# Patient Record
Sex: Female | Born: 1950 | Race: White | Hispanic: No | Smoking: Never smoker
Health system: Southern US, Community
[De-identification: ages and names within clinical notes are randomized; demographics above are authoritative.]

## PROBLEM LIST (undated history)

## (undated) DIAGNOSIS — M5126 Other intervertebral disc displacement, lumbar region: Secondary | ICD-10-CM

## (undated) DIAGNOSIS — M797 Fibromyalgia: Secondary | ICD-10-CM

## (undated) DIAGNOSIS — I10 Essential (primary) hypertension: Secondary | ICD-10-CM

## (undated) DIAGNOSIS — K219 Gastro-esophageal reflux disease without esophagitis: Secondary | ICD-10-CM

## (undated) DIAGNOSIS — E7800 Pure hypercholesterolemia, unspecified: Secondary | ICD-10-CM

## (undated) DIAGNOSIS — F329 Major depressive disorder, single episode, unspecified: Secondary | ICD-10-CM

## (undated) DIAGNOSIS — E559 Vitamin D deficiency, unspecified: Secondary | ICD-10-CM

## (undated) DIAGNOSIS — F3289 Other specified depressive episodes: Secondary | ICD-10-CM

## (undated) HISTORY — DX: Other intervertebral disc displacement, lumbar region: M51.26

## (undated) HISTORY — DX: Vitamin D deficiency, unspecified: E55.9

## (undated) HISTORY — DX: Pure hypercholesterolemia, unspecified: E78.00

## (undated) HISTORY — PX: TONSILLECTOMY: SUR1361

## (undated) HISTORY — DX: Other specified depressive episodes: F32.89

## (undated) HISTORY — PX: TUBAL LIGATION: SHX77

## (undated) HISTORY — DX: Gastro-esophageal reflux disease without esophagitis: K21.9

## (undated) HISTORY — DX: Fibromyalgia: M79.7

## (undated) HISTORY — DX: Essential (primary) hypertension: I10

## (undated) HISTORY — DX: Major depressive disorder, single episode, unspecified: F32.9

## (undated) HISTORY — PX: LAPAROSCOPIC CHOLECYSTECTOMY: SUR755

---

## 2006-06-29 ENCOUNTER — Emergency Department (HOSPITAL_COMMUNITY): Admission: EM | Admit: 2006-06-29 | Discharge: 2006-06-29 | Payer: Self-pay | Admitting: Emergency Medicine

## 2011-12-25 DIAGNOSIS — R079 Chest pain, unspecified: Secondary | ICD-10-CM

## 2012-03-13 ENCOUNTER — Encounter: Payer: Self-pay | Admitting: Cardiology

## 2012-04-09 ENCOUNTER — Encounter: Payer: Self-pay | Admitting: *Deleted

## 2012-04-09 ENCOUNTER — Ambulatory Visit (INDEPENDENT_AMBULATORY_CARE_PROVIDER_SITE_OTHER): Payer: Medicare Other | Admitting: Cardiology

## 2012-04-09 ENCOUNTER — Encounter: Payer: Self-pay | Admitting: Cardiology

## 2012-04-09 VITALS — BP 100/66 | HR 61 | Ht 64.0 in | Wt 169.0 lb

## 2012-04-09 DIAGNOSIS — E785 Hyperlipidemia, unspecified: Secondary | ICD-10-CM | POA: Insufficient documentation

## 2012-04-09 DIAGNOSIS — I10 Essential (primary) hypertension: Secondary | ICD-10-CM

## 2012-04-09 DIAGNOSIS — R079 Chest pain, unspecified: Secondary | ICD-10-CM | POA: Insufficient documentation

## 2012-04-09 DIAGNOSIS — R072 Precordial pain: Secondary | ICD-10-CM

## 2012-04-09 NOTE — Patient Instructions (Signed)
   Stress Echo  Office will contact with results Continue all current medications. Follow up in  6 weeks

## 2012-04-09 NOTE — Assessment & Plan Note (Signed)
Continue statin. 

## 2012-04-09 NOTE — Progress Notes (Signed)
HPI: 61 yo female for evaluation of chest pain. Labs 11/13 with BUN and Cr 26 and 0.87. LFTs normal. Hgb Normal. Patient has had intermittent CP for years. Pain increases with stress. Substernal and described as a sharp pain. No radiation or associated symptoms. Lasts several minutes and resolves spontaneously. No dyspnea. Because of the above, we were asked to evaluate.  Current Outpatient Prescriptions  Medication Sig Dispense Refill  . acetaminophen (TYLENOL) 500 MG tablet Take 500 mg by mouth every 6 (six) hours as needed.      . cetirizine (ZYRTEC) 10 MG tablet Take 10 mg by mouth daily. Alternate with claritan      . ergocalciferol (VITAMIN D2) 50000 UNITS capsule Take 50,000 Units by mouth once a week.      . fluticasone (FLONASE) 50 MCG/ACT nasal spray Place 2 sprays into the nose daily.      Marland Kitchen gabapentin (NEURONTIN) 300 MG capsule Take 300-600 mg by mouth 3 (three) times daily.      Marland Kitchen loratadine (CLARITIN) 10 MG tablet Take 10 mg by mouth daily. Alternate between zyrtec      . metoprolol (LOPRESSOR) 50 MG tablet Take 25 mg by mouth 2 (two) times daily.      . sertraline (ZOLOFT) 100 MG tablet Take 50-100 mg by mouth daily.       . simvastatin (ZOCOR) 20 MG tablet Take 20 mg by mouth every evening.      . triamterene-hydrochlorothiazide (MAXZIDE-25) 37.5-25 MG per tablet Take 1 tablet by mouth daily.        No Known Allergies  Past Medical History  Diagnosis Date  . Essential hypertension, benign   . Displacement of lumbar intervertebral disc without myelopathy   . Fibromyalgia   . Depressive disorder, not elsewhere classified   . Pure hypercholesterolemia   . Unspecified vitamin D deficiency   . GERD (gastroesophageal reflux disease)     Past Surgical History  Procedure Date  . Tonsillectomy   . Laparoscopic cholecystectomy     with bile leak, ERCP and stent  . Tubal ligation     History   Social History  . Marital Status: Divorced    Spouse Name: N/A    Number of  Children: 3  . Years of Education: N/A   Occupational History  .      Disabled    Social History Main Topics  . Smoking status: Never Smoker   . Smokeless tobacco: Never Used  . Alcohol Use: No  . Drug Use: No  . Sexually Active: Not on file   Other Topics Concern  . Not on file   Social History Narrative  . No narrative on file    Family History  Problem Relation Age of Onset  . Heart attack Father     Died of MI at age 56  . Dementia Mother     Alive    ROS: no fevers or chills, productive cough, hemoptysis, dysphasia, odynophagia, melena, hematochezia, dysuria, hematuria, rash, seizure activity, orthopnea, PND, pedal edema, claudication. Remaining systems are negative.  Physical Exam:   Blood pressure 100/66, pulse 61, height 5\' 4"  (1.626 m), weight 169 lb (76.658 kg).  General:  Well developed/well nourished in NAD Skin warm/dry Patient not depressed No peripheral clubbing Back-normal HEENT-normal/normal eyelids Neck supple/normal carotid upstroke bilaterally; no bruits; no JVD; no thyromegaly chest - CTA/ normal expansion CV - RRR/normal S1 and S2; no murmurs, rubs or gallops;  PMI nondisplaced Abdomen -NT/ND, no HSM, no mass, +  bowel sounds, no bruit 2+ femoral pulses, no bruits Ext-no edema, chords, 2+ DP Neuro-grossly nonfocal  ECG Sinus bradycardia with no ST changes.

## 2012-04-09 NOTE — Assessment & Plan Note (Signed)
Continue present BP meds  

## 2012-04-09 NOTE — Assessment & Plan Note (Signed)
Symptoms atypical; plan stress echo; if neg, no further cardiac evaluation.

## 2012-04-11 ENCOUNTER — Other Ambulatory Visit: Payer: Self-pay | Admitting: *Deleted

## 2012-04-11 DIAGNOSIS — R072 Precordial pain: Secondary | ICD-10-CM

## 2012-04-15 ENCOUNTER — Encounter: Payer: Self-pay | Admitting: *Deleted

## 2012-05-22 ENCOUNTER — Ambulatory Visit: Payer: Medicare Other | Admitting: Physician Assistant

## 2012-05-31 ENCOUNTER — Encounter: Payer: Self-pay | Admitting: Physician Assistant

## 2012-05-31 ENCOUNTER — Ambulatory Visit (INDEPENDENT_AMBULATORY_CARE_PROVIDER_SITE_OTHER): Payer: Medicare Other | Admitting: Physician Assistant

## 2012-05-31 VITALS — BP 100/60 | HR 59 | Ht 64.5 in | Wt 169.8 lb

## 2012-05-31 DIAGNOSIS — R079 Chest pain, unspecified: Secondary | ICD-10-CM

## 2012-05-31 MED ORDER — ASPIRIN EC 81 MG PO TBEC: 81.0000 mg | DELAYED_RELEASE_TABLET | Freq: Every day | ORAL | Status: AC

## 2012-05-31 NOTE — Patient Instructions (Signed)
   Begin Aspirin 81mg  daily Continue all other current medications. Follow up as needed

## 2012-05-31 NOTE — Assessment & Plan Note (Signed)
Followed by primary M.D. 

## 2012-05-31 NOTE — Assessment & Plan Note (Signed)
No further workup indicated. We reviewed the results of the recent normal stress echo test. She presents with no interim development of exertional CP. She has cardiac risk factors notable for HTN, HLD, FH, and age, and I suggested she start ASA 81 mg daily for primary prevention. She is to otherwise return to [place] on an as-needed basis.

## 2012-05-31 NOTE — Progress Notes (Signed)
Primary Cardiologist: Simona Huh, MD (new)   HPI: Scheduled early followup for review of recent [facility] echocardiogram, ordered by Dr. Jens Som at time of last OV, for evaluation of intermittent CP.   Patient presented with no known history of CAD. However, she  became concerned after turning age 62, given that her father died of an MI at age 57.   - GXT echocardiogram, January 4 (89% [facility]PH[facility]): Negative stress echo; EF 55-60%, no WMAs  Patient returns today feeling much better, and reporting no exertional CP.  No Known Allergies  Current Outpatient Prescriptions  Medication Sig Dispense Refill  . acetaminophen (TYLENOL) 500 MG tablet Take 500 mg by mouth every 6 (six) hours as needed.      . cetirizine (ZYRTEC) 10 MG tablet Take 10 mg by mouth daily. Alternate with claritan      . ergocalciferol (VITAMIN D2) 50000 UNITS capsule Take 50,000 Units by mouth once a week.      . fluticasone (FLONASE) 50 MCG/ACT nasal spray Place 2 sprays into the nose daily.      Marland Kitchen gabapentin (NEURONTIN) 300 MG capsule Take 300-600 mg by mouth 3 (three) times daily.      . metoprolol (LOPRESSOR) 50 MG tablet Take 25 mg by mouth 2 (two) times daily.      . sertraline (ZOLOFT) 100 MG tablet Take 50-100 mg by mouth daily.       . simvastatin (ZOCOR) 20 MG tablet Take 20 mg by mouth every evening.      . triamterene-hydrochlorothiazide (MAXZIDE-25) 37.5-25 MG per tablet Take 1 tablet by mouth daily.      Marland Kitchen aspirin EC 81 MG tablet Take 1 tablet (81 mg total) by mouth daily.       No current facility-administered medications for this visit.    Past Medical History  Diagnosis Date  . Essential hypertension, benign   . Displacement of lumbar intervertebral disc without myelopathy   . Fibromyalgia   . Depressive disorder, not elsewhere classified   . Pure hypercholesterolemia   . Unspecified vitamin D deficiency   . GERD (gastroesophageal reflux disease)     Past Surgical History  Procedure Laterality Date  .  Tonsillectomy    . Laparoscopic cholecystectomy      with bile leak, ERCP and stent  . Tubal ligation      History   Social History  . Marital Status: Divorced    Spouse Name: N/A    Number of Children: 3  . Years of Education: N/A   Occupational History  .      Disabled    Social History Main Topics  . Smoking status: Never Smoker   . Smokeless tobacco: Never Used  . Alcohol Use: No  . Drug Use: No  . Sexually Active: Not on file   Other Topics Concern  . Not on file   Social History Narrative  . No narrative on file    Family History  Problem Relation Age of Onset  . Heart attack Father     Died of MI at age 58  . Dementia Mother     Alive    ROS: no nausea, vomiting; no fever, chills; no melena, hematochezia; no claudication  PHYSICAL EXAM: BP 100/60  Pulse 59  Ht 5' 4.5" (1.638 m)  Wt 169 lb 12.8 oz (77.021 kg)  BMI 28.71 kg/m2  SpO2 96% GENERAL: 62 year old female; NAD HEENT: NCAT, PERRLA, EOMI; sclera clear; no xanthelasma NECK: palpable bilateral carotid pulses, no bruits;  no JVD; no TM LUNGS: CTA bilaterally CARDIAC: RRR (S1, S2); no significant murmurs; no rubs or gallops ABDOMEN: soft, non-tender; intact BS EXTREMETIES: no significant peripheral edema SKIN: warm/dry; no obvious rash/lesions MUSCULOSKELETAL: no joint deformity NEURO: no focal deficit; NL affect   EKG:    ASSESSMENT & PLAN:  Chest pain No further workup indicated. We reviewed the results of the recent normal stress echo test. She presents with no interim development of exertional CP. She has cardiac risk factors notable for HTN, HLD, FH, and age, and I suggested she start ASA 81 mg daily for primary prevention. She is to otherwise return to [place] on an as-needed basis.  Essential hypertension Followed by primary M.D.  Hyperlipidemia Followed by primary M.D.    Gene Anaeli Cornwall, PAC

## 2013-04-09 ENCOUNTER — Telehealth: Payer: Self-pay

## 2013-04-09 NOTE — Telephone Encounter (Signed)
Sent letter to patient to advise of new physicians in cardiology.

## 2015-08-24 ENCOUNTER — Ambulatory Visit: Payer: Self-pay | Admitting: Allergy and Immunology

## 2015-10-13 ENCOUNTER — Emergency Department (HOSPITAL_COMMUNITY): Payer: Medicare Other

## 2015-10-13 ENCOUNTER — Encounter (HOSPITAL_COMMUNITY): Payer: Self-pay | Admitting: Emergency Medicine

## 2015-10-13 ENCOUNTER — Emergency Department (HOSPITAL_COMMUNITY)
Admission: EM | Admit: 2015-10-13 | Discharge: 2015-10-13 | Disposition: A | Discharge status: Home / Self Care | Payer: Medicare Other | Attending: Emergency Medicine | Admitting: Emergency Medicine

## 2015-10-13 DIAGNOSIS — Y999 Unspecified external cause status: Secondary | ICD-10-CM | POA: Insufficient documentation

## 2015-10-13 DIAGNOSIS — S93601A Unspecified sprain of right foot, initial encounter: Secondary | ICD-10-CM | POA: Diagnosis not present

## 2015-10-13 DIAGNOSIS — Y929 Unspecified place or not applicable: Secondary | ICD-10-CM | POA: Diagnosis not present

## 2015-10-13 DIAGNOSIS — Y939 Activity, unspecified: Secondary | ICD-10-CM | POA: Insufficient documentation

## 2015-10-13 DIAGNOSIS — F329 Major depressive disorder, single episode, unspecified: Secondary | ICD-10-CM | POA: Insufficient documentation

## 2015-10-13 DIAGNOSIS — I10 Essential (primary) hypertension: Secondary | ICD-10-CM | POA: Diagnosis not present

## 2015-10-13 DIAGNOSIS — S99921A Unspecified injury of right foot, initial encounter: Secondary | ICD-10-CM | POA: Diagnosis present

## 2015-10-13 DIAGNOSIS — W010XXA Fall on same level from slipping, tripping and stumbling without subsequent striking against object, initial encounter: Secondary | ICD-10-CM | POA: Insufficient documentation

## 2015-10-13 MED ORDER — IBUPROFEN 600 MG PO TABS: 600.0000 mg | ORAL_TABLET | Freq: Four times a day (QID) | ORAL | Status: DC | PRN

## 2015-10-13 NOTE — ED Notes (Signed)
Patient complains of right foot pain from fall this morning. Patient states slipped and twisted ankle. Denies hitting head.

## 2015-10-13 NOTE — Discharge Instructions (Signed)
Foot Sprain °A foot sprain is an injury to one of the strong bands of tissue (ligaments) that connect and support the many bones in your feet. The ligament can be stretched too much or it can tear. A tear can be either partial or complete. The severity of the sprain depends on how much of the ligament was damaged or torn. °CAUSES °A foot sprain is usually caused by suddenly twisting or pivoting your foot. °RISK FACTORS °This injury is more likely to occur in people who: °· Play a sport, such as basketball or football. °· Exercise or play a sport without warming up. °· Start a new workout or sport. °· Suddenly increase how long or hard they exercise or play a sport. °SYMPTOMS °Symptoms of this condition start soon after an injury and include: °· Pain, especially in the arch of the foot. °· Bruising. °· Swelling. °· Inability to walk or use the foot to support body weight. °DIAGNOSIS °This condition is diagnosed with a medical history and physical exam. You may also have imaging tests, such as: °· X-rays to make sure there are no broken bones (fractures). °· MRI to see if the ligament has torn. °TREATMENT °Treatment varies depending on the severity of your sprain. Mild sprains can be treated with rest, ice, compression, and elevation (RICE). If your ligament is overstretched or partially torn, treatment usually involves keeping your foot in a fixed position (immobilization) for a period of time. To help you do this, your health care provider will apply a bandage, splint, or walking boot to keep your foot from moving until it heals. You may also be advised to use crutches or a scooter for a few weeks to avoid bearing weight on your foot while it is healing. °If your ligament is fully torn, you may need surgery to reconnect the ligament to the bone. After surgery, a cast or splint will be applied and will need to stay on your foot while it heals. °Your health care provider may also suggest exercises or physical therapy  to strengthen your foot. °HOME CARE INSTRUCTIONS °If You Have a Bandage, Splint, or Walking Boot: °· Wear it as directed by your health care provider. Remove it only as directed by your health care provider. °· Loosen the bandage, splint, or walking boot if your toes become numb and tingle, or if they turn cold and blue. °Bathing °· If your health care provider approves bathing and showering, cover the bandage or splint with a watertight plastic bag to protect it from water. Do not let the bandage or splint get wet. °Managing Pain, Stiffness, and Swelling  °· If directed, apply ice to the injured area: °¨ Put ice in a plastic bag. °¨ Place a towel between your skin and the bag. °¨ Leave the ice on for 20 minutes, 2-3 times per day. °· Move your toes often to avoid stiffness and to lessen swelling. °· Raise (elevate) the injured area above the level of your heart while you are sitting or lying down. °Driving °· Do not drive or operate heavy machinery while taking pain medicine. °· Do not drive while wearing a bandage, splint, or walking boot on a foot that you use for driving. °Activity °· Rest as directed by your health care provider. °· Do not use the injured foot to support your body weight until your health care provider says that you can. Use crutches or other supportive devices as directed by your health care provider. °· Ask your health care   provider what activities are safe for you. Gradually increase how much and how far you walk until your health care provider says it is safe to return to full activity.  Do any exercise or physical therapy as directed by your health care provider. General Instructions  If a splint was applied, do not put pressure on any part of it until it is fully hardened. This may take several hours.  Take medicines only as directed by your health care provider. These include over-the-counter medicines and prescription medicines.  Keep all follow-up visits as directed by your  health care provider. This is important.  When you can walk without pain, wear supportive shoes that have stiff soles. Do not wear flip-flops, and do not walk barefoot. SEEK MEDICAL CARE IF:  Your pain is not controlled with medicine.  Your bruising or swelling gets worse or does not get better with treatment.  Your splint or walking boot is damaged. SEEK IMMEDIATE MEDICAL CARE IF:  Your foot is numb or blue.  Your foot feels colder than normal.   This information is not intended to replace advice given to you by your health care provider. Make sure you discuss any questions you have with your health care provider.   Document Released: 09/16/2001 Document Revised: 08/11/2014 Document Reviewed: 01/28/2014 Elsevier Interactive Patient Education 2016 [employer]    Wear the ASO brace as much as possible when walking.    Use ice and elevation as much as possible for the next several days to help reduce the swelling.  Call your doctor for a recheck of your foot in one week if not improving.  You may benefit from physical therapy of your foot and ankle if it is not getting better over the next week.

## 2015-10-13 NOTE — ED Provider Notes (Signed)
CSN: [id number]     Arrival date & time 10/13/15  1610 History   First MD Initiated Contact with Patient 10/13/15 1622     Chief Complaint  Patient presents with  . Foot Pain     (Consider location/radiation/quality/duration/timing/severity/associated sxs/prior Treatment) The history is provided by the patient.   Melanie Hunter is a 65 y.o. female presenting with right ankle pain  which occurred suddenly when the patient slipped in a puddle of mud in her basement around 10 am today, causing inversion of the right foot and ankle but not a complete fall.  Pain is aching, constant and worse with palpation, movement and weight bearing.  The patient was able to weight bear immediately after the event.  There is no radiation of pain and the patient denies numbness distal to the injury site.  The patients treatment prior to arrival included an otc pain reliever (unsure of name) and a gabapentin which she takes prn for lumbar nerve pain. She denies any head injury.     Past Medical History  Diagnosis Date  . Essential hypertension, benign   . Displacement of lumbar intervertebral disc without myelopathy   . Fibromyalgia   . Depressive disorder, not elsewhere classified   . Pure hypercholesterolemia   . Unspecified vitamin D deficiency   . GERD (gastroesophageal reflux disease)    Past Surgical History  Procedure Laterality Date  . Tonsillectomy    . Laparoscopic cholecystectomy      with bile leak, ERCP and stent  . Tubal ligation     Family History  Problem Relation Age of Onset  . Heart attack Father     Died of MI at age 64  . Dementia Mother     Alive   Social History  Substance Use Topics  . Smoking status: Never Smoker   . Smokeless tobacco: Never Used  . Alcohol Use: No   OB History    No data available     Review of Systems  Musculoskeletal: Positive for joint swelling and arthralgias.  Skin: Negative for wound.  Neurological: Negative for weakness and  numbness.      Allergies  Review of patient's allergies indicates no known allergies.  Home Medications   Prior to Admission medications   Medication Sig Start Date End Date Taking? Authorizing Provider  acetaminophen (TYLENOL) 500 MG tablet Take 500 mg by mouth every 6 (six) hours as needed.    Historical Provider, MD  aspirin EC 81 MG tablet Take 1 tablet (81 mg total) by mouth daily. 05/31/12   Rande BruntEugene C Serpe, PA-C  cetirizine (ZYRTEC) 10 MG tablet Take 10 mg by mouth daily. Alternate with claritan    Historical Provider, MD  ergocalciferol (VITAMIN D2) 50000 UNITS capsule Take 50,000 Units by mouth once a week.    Historical Provider, MD  fluticasone (FLONASE) 50 MCG/ACT nasal spray Place 2 sprays into the nose daily.    Historical Provider, MD  gabapentin (NEURONTIN) 300 MG capsule Take 300-600 mg by mouth 3 (three) times daily.    Historical Provider, MD  ibuprofen (ADVIL,MOTRIN) 600 MG tablet Take 1 tablet (600 mg total) by mouth every 6 (six) hours as needed. 10/13/15   Burgess AmorJulie Apple Dearmas, PA-C  metoprolol (LOPRESSOR) 50 MG tablet Take 25 mg by mouth 2 (two) times daily.    Historical Provider, MD  sertraline (ZOLOFT) 100 MG tablet Take 50-100 mg by mouth daily.     Historical Provider, MD  simvastatin (ZOCOR) 20 MG tablet Take  20 mg by mouth every evening.    Historical Provider, MD  triamterene-hydrochlorothiazide (MAXZIDE-25) 37.5-25 MG per tablet Take 1 tablet by mouth daily.    Historical Provider, MD   BP 113/61 mmHg  Pulse 78  Temp(Src) 98.3 F (36.8 C) (Oral)  Resp 16  Ht 5\' 4"  (1.626 m)  Wt 76.658 kg  BMI 28.99 kg/m2  SpO2 100% Physical Exam  Constitutional: She appears well-developed and well-nourished.  HENT:  Head: Normocephalic.  Cardiovascular: Normal rate and intact distal pulses.  Exam reveals no decreased pulses.   Pulses:      Dorsalis pedis pulses are 2+ on the right side, and 2+ on the left side.       Posterior tibial pulses are 2+ on the right side, and 2+  on the left side.  Musculoskeletal: She exhibits edema and tenderness.       Right ankle: She exhibits normal range of motion, no swelling, no ecchymosis, no deformity and normal pulse. No tenderness. No lateral malleolus, no head of 5th metatarsal and no proximal fibula tenderness found. Achilles tendon normal. Achilles tendon exhibits no pain.       Right foot: There is bony tenderness and swelling. There is normal range of motion, normal capillary refill, no crepitus and no deformity.       Feet:  ttp with contusion and mild edema right proximal medial dorsal foot.  Neurological: She is alert. No sensory deficit.  Skin: Skin is warm, dry and intact.  Nursing note and vitals reviewed.   ED Course  Procedures (including critical care time) Labs Review Labs Reviewed - No data to display  Imaging Review Dg Ankle Complete Right  10/13/2015  CLINICAL DATA:  Injury. EXAM: RIGHT ANKLE - COMPLETE 3+ VIEW COMPARISON:  No recent prior. FINDINGS: No acute bony or joint abnormality identified. Corticated bony density noted anterior to the distal tibia, most likely related to degenerative change. Malleoli are intact. IMPRESSION: Degenerative change.  No acute abnormality. Electronically Signed   By: Maisie Fushomas  Register   On: 10/13/2015 17:09   Dg Foot Complete Right  10/13/2015  CLINICAL DATA:  Injury.  Pain. EXAM: RIGHT FOOT COMPLETE - 3+ VIEW COMPARISON:  No recent prior. FINDINGS: No acute bony or joint abnormality identified. No evidence of fracture dislocation. Diffuse degenerative change. Degenerative changes most prominent the first metatarsophalangeal joint. IMPRESSION: No acute abnormality. Electronically Signed   By: Maisie Fushomas  Register   On: 10/13/2015 17:11   I have personally reviewed and evaluated these images and lab results as part of my medical decision-making.   EKG Interpretation None      MDM   Final diagnoses:  Foot sprain, right, initial encounter    Imaging reviewed and  discussed with patients.  She was placed in an ASO, advised rest as compression and elevation.  Follow up with PCP if symptoms are not improving over the next week.  Continue with current home meds.    Burgess AmorJulie Conn Trombetta, PA-C 10/13/15 1734  Vanetta MuldersScott Zackowski, MD 10/13/15 [phone]

## 2017-01-31 IMAGING — DX DG ANKLE COMPLETE 3+V*R*
4 series · 4 of 4 positions shown · non-contrast
Comparison: No recent prior.

CLINICAL DATA: Injury.

EXAM:
RIGHT ANKLE - COMPLETE 3+ VIEW

[ankle ap (1 of 2)]
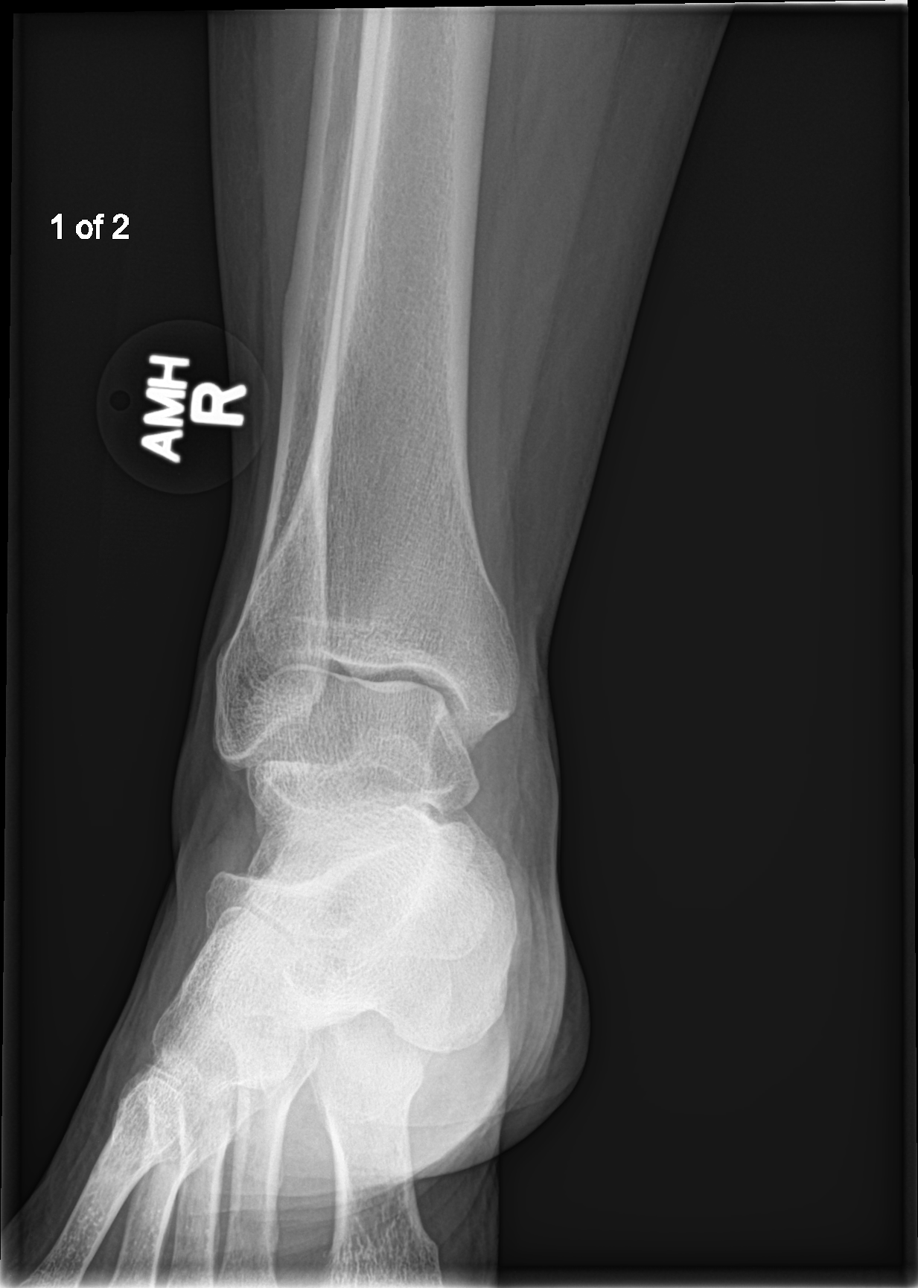

[ankle obl]
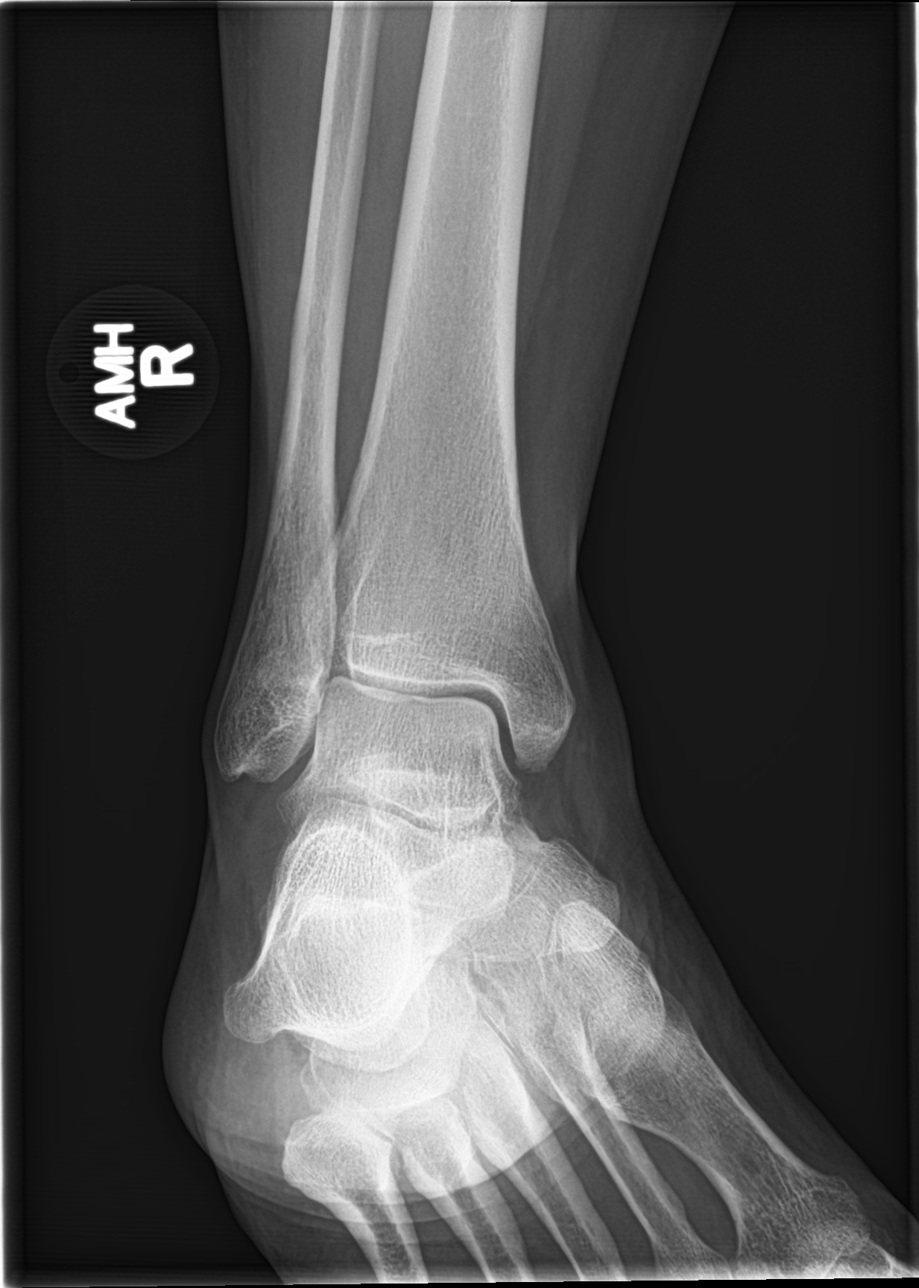

[ankle lat]
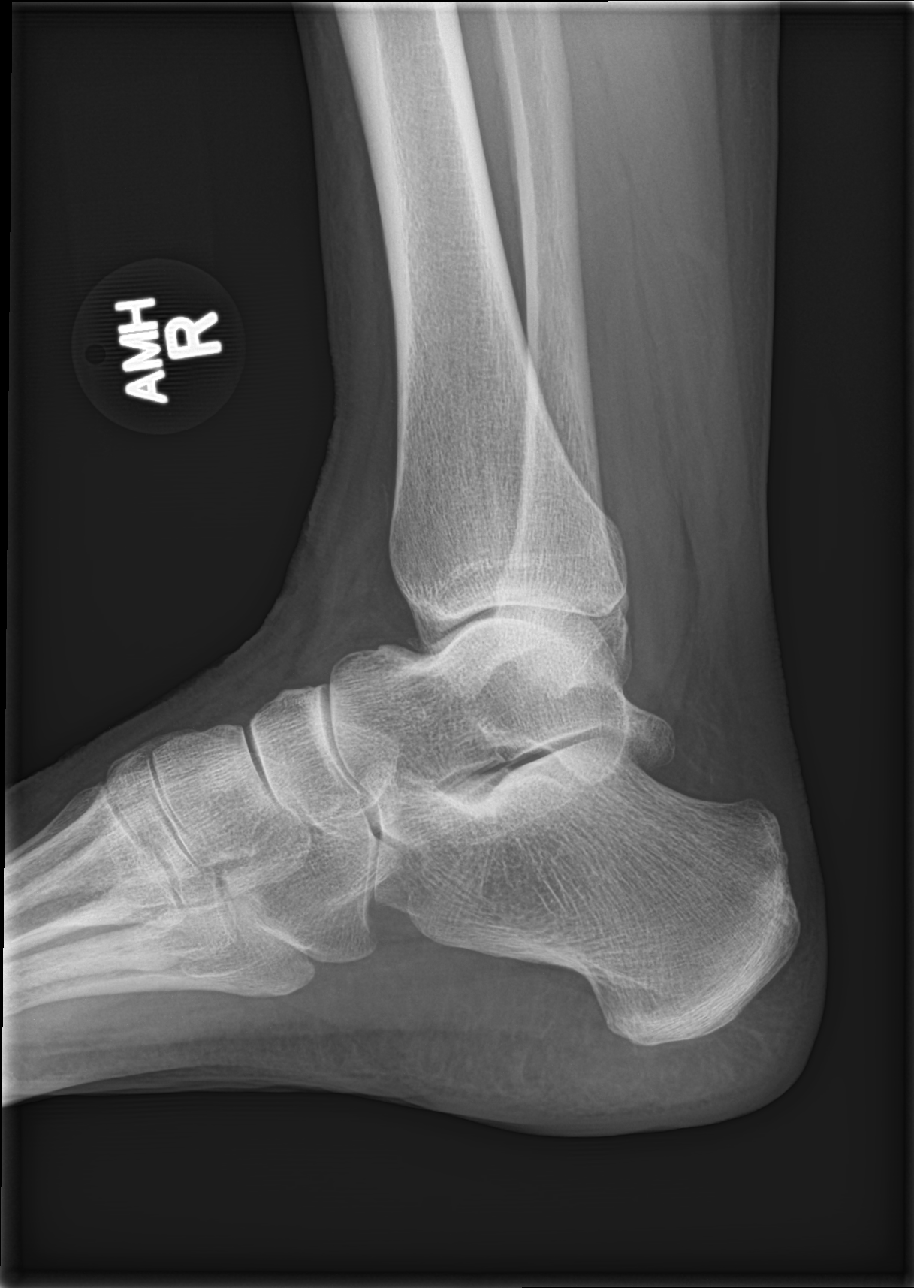

[ankle ap (2 of 2)]
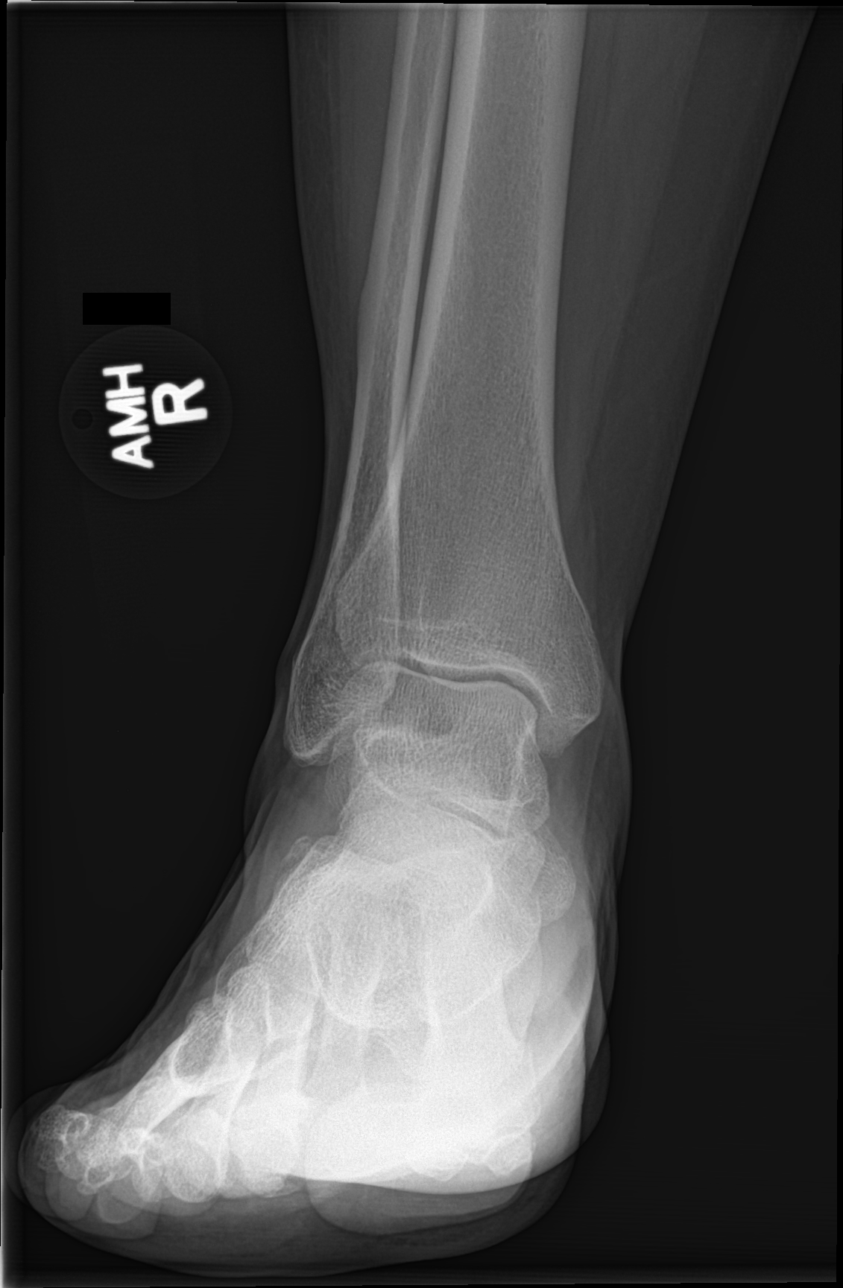

[4 of 4 positions shown; findings below may reference images not displayed]

FINDINGS: No acute bony or joint abnormality identified. Corticated bony
density noted anterior to the distal tibia, most likely related to
degenerative change. Malleoli are intact.
IMPRESSION: Degenerative change.  No acute abnormality.

## 2017-01-31 IMAGING — DX DG FOOT COMPLETE 3+V*R*
3 series · 3 of 3 positions shown · non-contrast
Comparison: No recent prior.

CLINICAL DATA: Injury.  Pain.

EXAM:
RIGHT FOOT COMPLETE - 3+ VIEW

[foot ap]
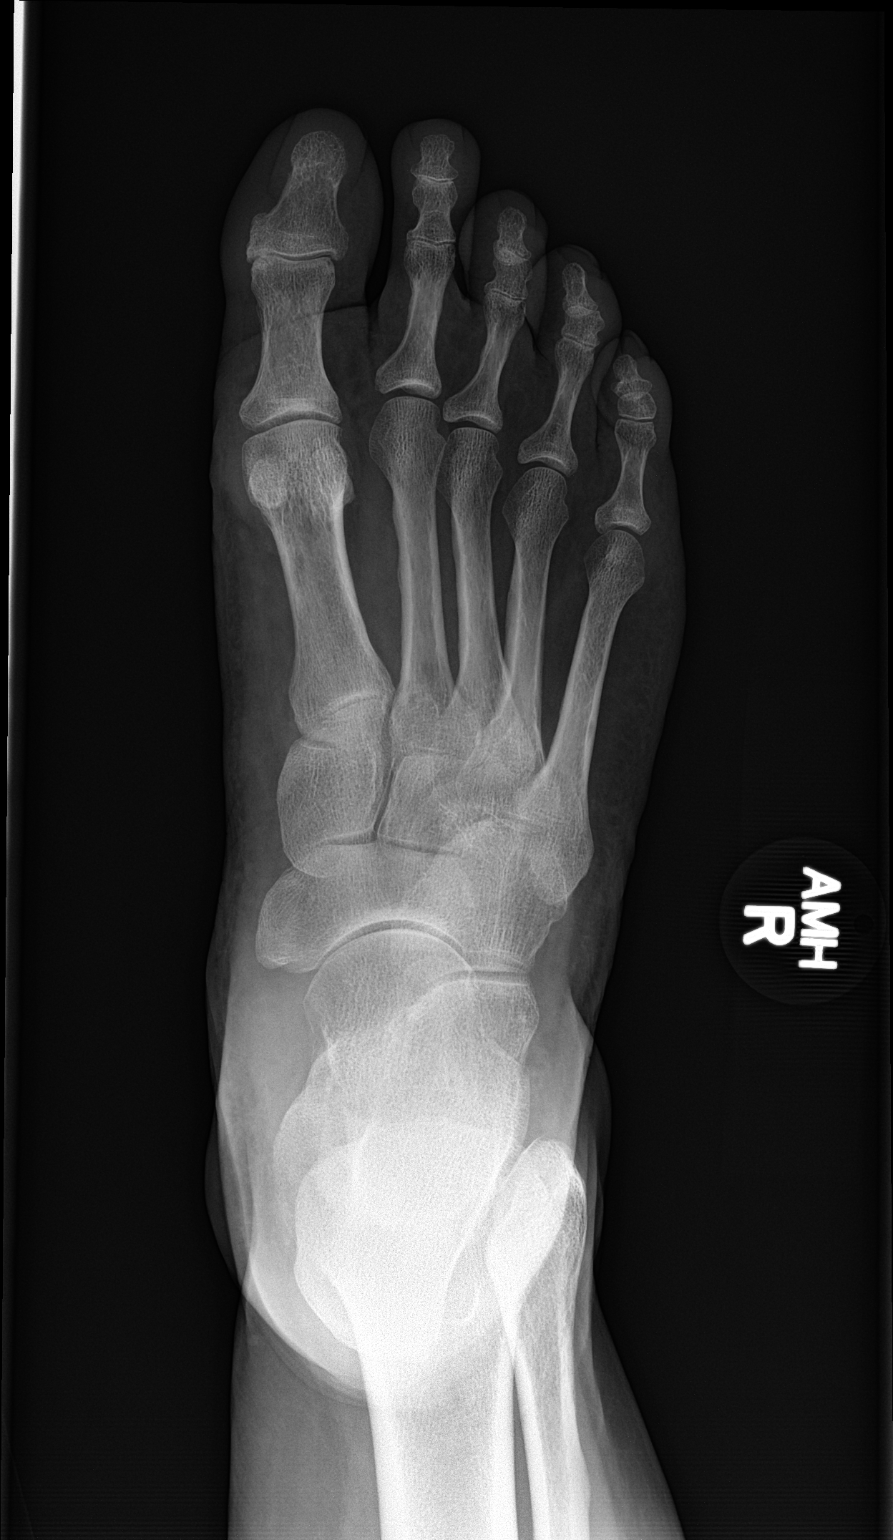

[foot obl]
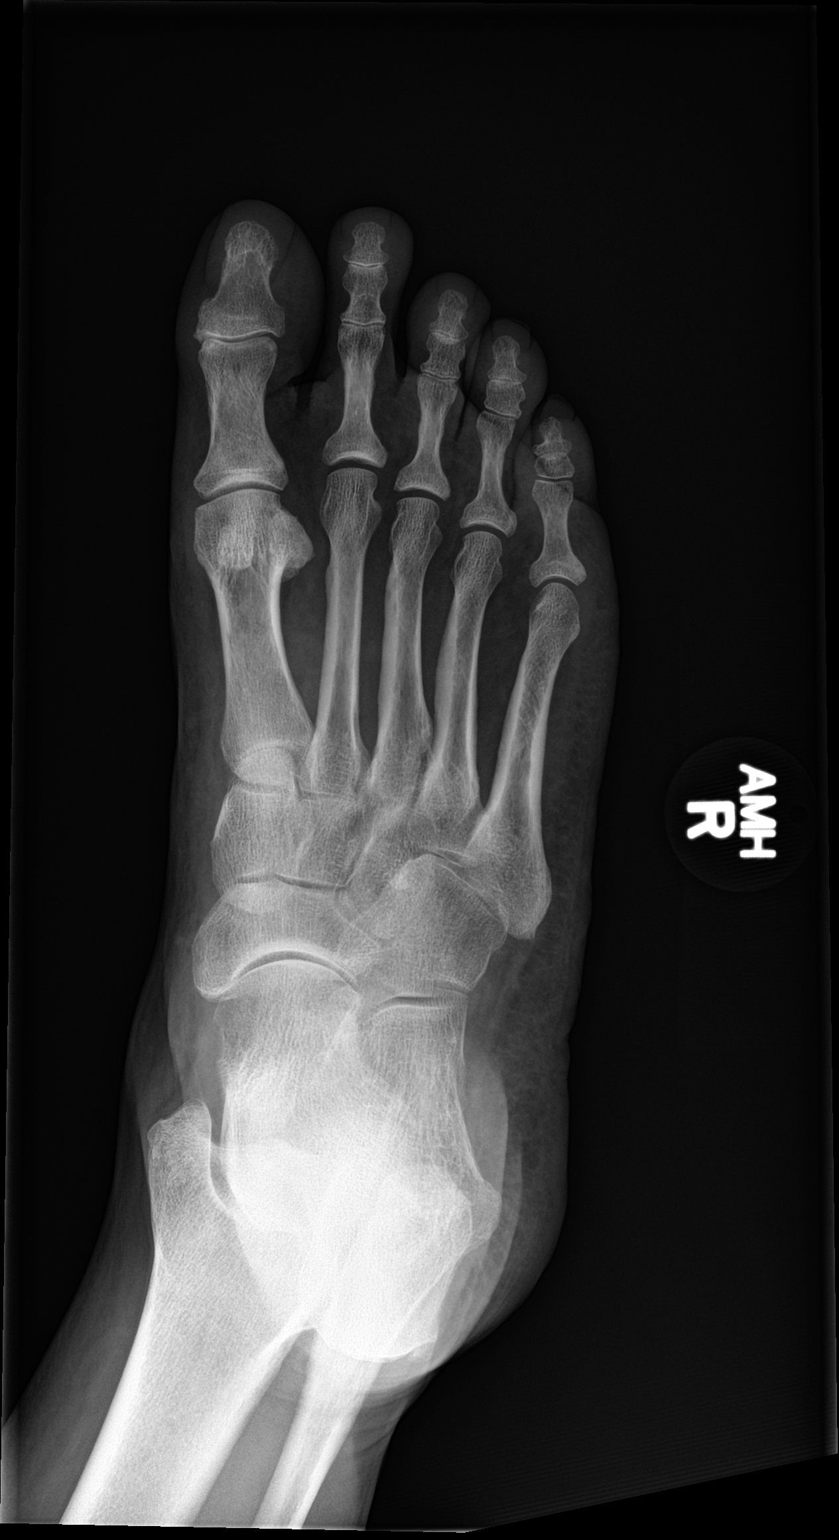

[foot lat]
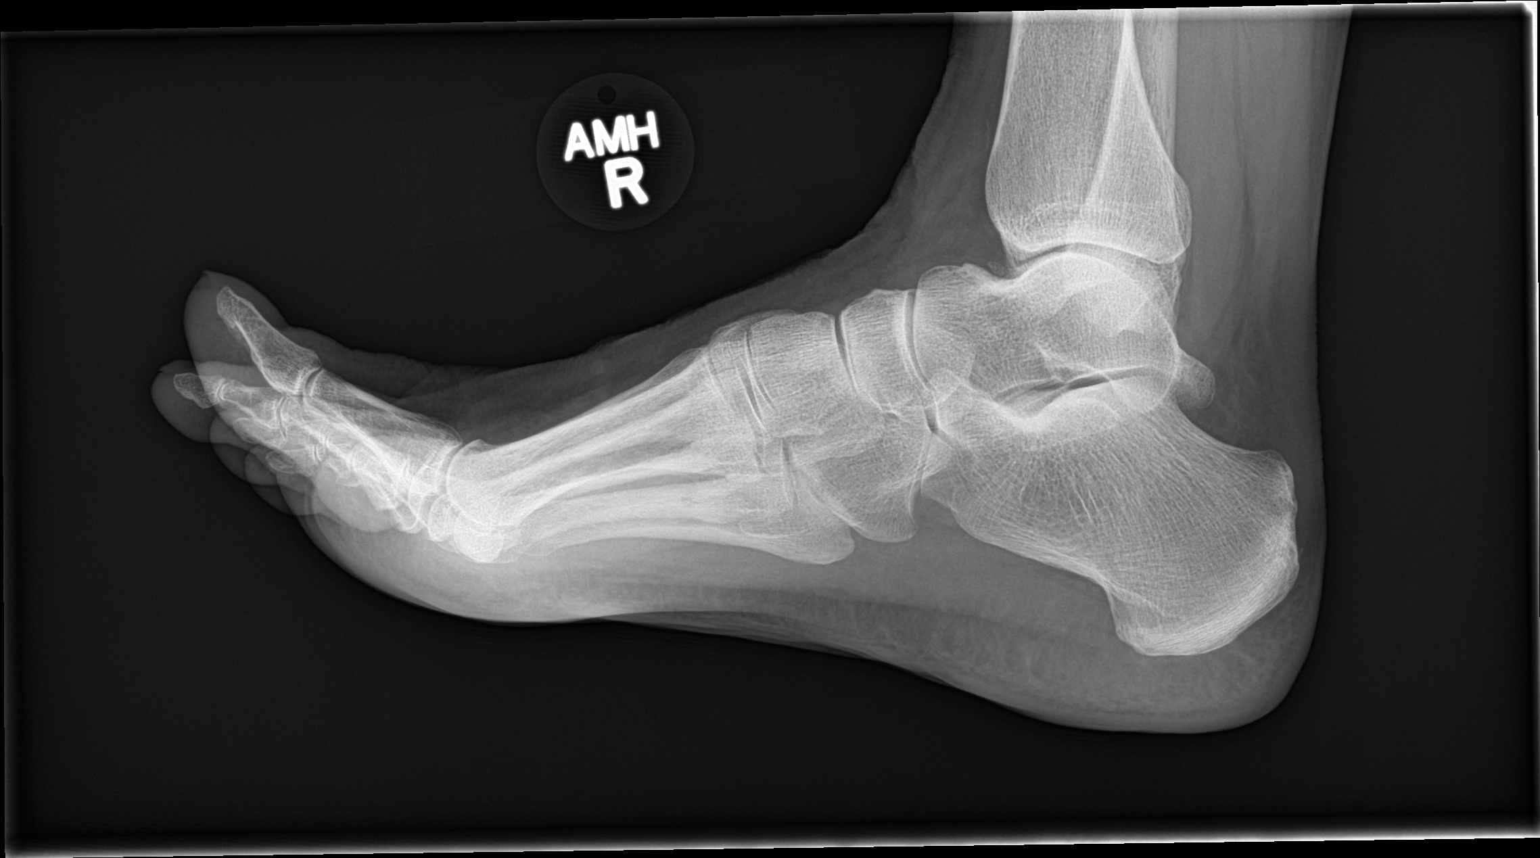

[3 of 3 positions shown; findings below may reference images not displayed]

FINDINGS: No acute bony or joint abnormality identified. No evidence of
fracture dislocation. Diffuse degenerative change. Degenerative
changes most prominent the first metatarsophalangeal joint.
IMPRESSION: No acute abnormality.

## 2017-03-28 ENCOUNTER — Ambulatory Visit (INDEPENDENT_AMBULATORY_CARE_PROVIDER_SITE_OTHER): Payer: Medicare Other | Admitting: Urology

## 2017-03-28 DIAGNOSIS — N3941 Urge incontinence: Secondary | ICD-10-CM | POA: Diagnosis not present

## 2018-02-08 DEATH — deceased
